# Patient Record
Sex: Female | Born: 1935 | Hispanic: Yes | Marital: Married | Smoking: Never smoker
Health system: Southern US, Community
[De-identification: ages and names within clinical notes are randomized; demographics above are authoritative.]

## PROBLEM LIST (undated history)

## (undated) DIAGNOSIS — Z780 Asymptomatic menopausal state: Secondary | ICD-10-CM

## (undated) DIAGNOSIS — M810 Age-related osteoporosis without current pathological fracture: Secondary | ICD-10-CM

## (undated) DIAGNOSIS — N905 Atrophy of vulva: Secondary | ICD-10-CM

## (undated) DIAGNOSIS — K219 Gastro-esophageal reflux disease without esophagitis: Secondary | ICD-10-CM

## (undated) DIAGNOSIS — N952 Postmenopausal atrophic vaginitis: Secondary | ICD-10-CM

## (undated) DIAGNOSIS — K449 Diaphragmatic hernia without obstruction or gangrene: Secondary | ICD-10-CM

## (undated) DIAGNOSIS — G4700 Insomnia, unspecified: Secondary | ICD-10-CM

## (undated) DIAGNOSIS — H9319 Tinnitus, unspecified ear: Secondary | ICD-10-CM

## (undated) HISTORY — PX: TONSILLECTOMY AND ADENOIDECTOMY: SUR1326

## (undated) HISTORY — DX: Atrophy of vulva: N90.5

## (undated) HISTORY — DX: Diaphragmatic hernia without obstruction or gangrene: K44.9

## (undated) HISTORY — DX: Gastro-esophageal reflux disease without esophagitis: K21.9

## (undated) HISTORY — DX: Tinnitus, unspecified ear: H93.19

## (undated) HISTORY — DX: Age-related osteoporosis without current pathological fracture: M81.0

## (undated) HISTORY — DX: Insomnia, unspecified: G47.00

## (undated) HISTORY — PX: CHOLECYSTECTOMY: SHX55

## (undated) HISTORY — PX: EYE SURGERY: SHX253

## (undated) HISTORY — DX: Asymptomatic menopausal state: Z78.0

## (undated) HISTORY — DX: Postmenopausal atrophic vaginitis: N95.2

---

## 2003-01-19 DIAGNOSIS — G4730 Sleep apnea, unspecified: Secondary | ICD-10-CM

## 2005-08-14 ENCOUNTER — Ambulatory Visit: Payer: Self-pay | Admitting: Unknown Physician Specialty

## 2005-09-08 ENCOUNTER — Ambulatory Visit: Payer: Self-pay | Admitting: Obstetrics and Gynecology

## 2006-10-18 ENCOUNTER — Ambulatory Visit: Payer: Self-pay | Admitting: Obstetrics and Gynecology

## 2007-01-03 ENCOUNTER — Ambulatory Visit: Payer: Self-pay | Admitting: Internal Medicine

## 2007-01-07 ENCOUNTER — Ambulatory Visit: Payer: Self-pay | Admitting: Internal Medicine

## 2007-11-07 ENCOUNTER — Ambulatory Visit: Payer: Self-pay | Admitting: Obstetrics and Gynecology

## 2008-11-09 ENCOUNTER — Ambulatory Visit: Payer: Self-pay | Admitting: Obstetrics and Gynecology

## 2008-11-23 ENCOUNTER — Ambulatory Visit: Payer: Self-pay | Admitting: Obstetrics and Gynecology

## 2010-02-03 ENCOUNTER — Ambulatory Visit: Payer: Self-pay | Admitting: Internal Medicine

## 2010-07-18 ENCOUNTER — Ambulatory Visit: Payer: Self-pay | Admitting: Internal Medicine

## 2011-08-24 ENCOUNTER — Ambulatory Visit: Payer: Self-pay | Admitting: Obstetrics and Gynecology

## 2012-07-29 ENCOUNTER — Ambulatory Visit: Payer: Self-pay | Admitting: Unknown Physician Specialty

## 2012-08-01 ENCOUNTER — Ambulatory Visit: Payer: Self-pay | Admitting: Unknown Physician Specialty

## 2012-08-19 ENCOUNTER — Ambulatory Visit: Payer: Self-pay | Admitting: Unknown Physician Specialty

## 2012-10-31 ENCOUNTER — Ambulatory Visit: Payer: Self-pay | Admitting: Obstetrics and Gynecology

## 2012-12-25 ENCOUNTER — Ambulatory Visit: Payer: Self-pay | Admitting: Internal Medicine

## 2012-12-31 ENCOUNTER — Ambulatory Visit: Payer: Self-pay | Admitting: Internal Medicine

## 2013-03-03 DIAGNOSIS — R634 Abnormal weight loss: Secondary | ICD-10-CM | POA: Insufficient documentation

## 2013-03-03 DIAGNOSIS — Q6211 Congenital occlusion of ureteropelvic junction: Secondary | ICD-10-CM

## 2013-03-03 DIAGNOSIS — Q6239 Other obstructive defects of renal pelvis and ureter: Secondary | ICD-10-CM | POA: Insufficient documentation

## 2013-07-30 ENCOUNTER — Ambulatory Visit: Payer: Self-pay | Admitting: Surgery

## 2013-07-30 LAB — CBC WITH DIFFERENTIAL/PLATELET
Basophil #: 0.1 10*3/uL (ref 0.0–0.1)
Eosinophil #: 0.1 10*3/uL (ref 0.0–0.7)
Eosinophil %: 2.2 %
HCT: 40.1 % (ref 35.0–47.0)
HGB: 13.7 g/dL (ref 12.0–16.0)
MCHC: 34.2 g/dL (ref 32.0–36.0)
Monocyte %: 5.7 %
Neutrophil #: 3.6 10*3/uL (ref 1.4–6.5)
RBC: 4.3 10*6/uL (ref 3.80–5.20)
WBC: 6.2 10*3/uL (ref 3.6–11.0)

## 2013-07-30 LAB — BASIC METABOLIC PANEL
BUN: 20 mg/dL — ABNORMAL HIGH (ref 7–18)
EGFR (African American): >60
EGFR (Non-African Amer.): >60
Glucose: 80 mg/dL (ref 65–99)
Osmolality: 281 (ref 275–301)
Potassium: 4.3 mmol/L (ref 3.5–5.1)

## 2013-08-01 ENCOUNTER — Ambulatory Visit: Payer: Self-pay | Admitting: Surgery

## 2013-08-01 LAB — URINALYSIS, COMPLETE
Glucose,UR: NEGATIVE mg/dL (ref 0–75)
Ketone: NEGATIVE
Nitrite: NEGATIVE
Ph: 6 (ref 4.5–8.0)
Protein: NEGATIVE
Specific Gravity: 1.015 (ref 1.003–1.030)

## 2013-08-07 ENCOUNTER — Ambulatory Visit: Payer: Self-pay | Admitting: Surgery

## 2013-08-08 LAB — PATHOLOGY REPORT

## 2013-08-10 ENCOUNTER — Emergency Department: Payer: Self-pay | Admitting: Emergency Medicine

## 2013-08-10 LAB — URINALYSIS, COMPLETE
Bilirubin,UR: NEGATIVE
Glucose,UR: NEGATIVE mg/dL (ref 0–75)
Ketone: NEGATIVE
Leukocyte Esterase: NEGATIVE
Ph: 5 (ref 4.5–8.0)
Protein: 25
RBC,UR: 1 /HPF (ref 0–5)

## 2013-08-10 LAB — COMPREHENSIVE METABOLIC PANEL
Alkaline Phosphatase: 108 U/L (ref 50–136)
Chloride: 104 mmol/L (ref 98–107)
Creatinine: 0.72 mg/dL (ref 0.60–1.30)
EGFR (African American): >60
Osmolality: 274 (ref 275–301)
SGPT (ALT): 46 U/L (ref 12–78)
Total Protein: 6.6 g/dL (ref 6.4–8.2)

## 2013-08-10 LAB — CBC
HGB: 13 g/dL (ref 12.0–16.0)
MCHC: 33.7 g/dL (ref 32.0–36.0)
RBC: 4.1 10*6/uL (ref 3.80–5.20)

## 2013-12-25 DIAGNOSIS — R42 Dizziness and giddiness: Secondary | ICD-10-CM | POA: Insufficient documentation

## 2013-12-25 DIAGNOSIS — R030 Elevated blood-pressure reading, without diagnosis of hypertension: Secondary | ICD-10-CM | POA: Insufficient documentation

## 2013-12-25 DIAGNOSIS — M79609 Pain in unspecified limb: Secondary | ICD-10-CM | POA: Insufficient documentation

## 2013-12-25 DIAGNOSIS — K219 Gastro-esophageal reflux disease without esophagitis: Secondary | ICD-10-CM | POA: Insufficient documentation

## 2013-12-25 DIAGNOSIS — M810 Age-related osteoporosis without current pathological fracture: Secondary | ICD-10-CM | POA: Insufficient documentation

## 2013-12-25 DIAGNOSIS — M1610 Unilateral primary osteoarthritis, unspecified hip: Secondary | ICD-10-CM | POA: Insufficient documentation

## 2013-12-25 DIAGNOSIS — H9209 Otalgia, unspecified ear: Secondary | ICD-10-CM | POA: Insufficient documentation

## 2013-12-25 DIAGNOSIS — M169 Osteoarthritis of hip, unspecified: Secondary | ICD-10-CM | POA: Insufficient documentation

## 2013-12-25 DIAGNOSIS — IMO0002 Reserved for concepts with insufficient information to code with codable children: Secondary | ICD-10-CM | POA: Insufficient documentation

## 2013-12-25 DIAGNOSIS — R002 Palpitations: Secondary | ICD-10-CM | POA: Insufficient documentation

## 2014-01-14 ENCOUNTER — Ambulatory Visit: Payer: Self-pay | Admitting: Internal Medicine

## 2014-07-09 DIAGNOSIS — E539 Vitamin B deficiency, unspecified: Secondary | ICD-10-CM | POA: Insufficient documentation

## 2014-08-17 ENCOUNTER — Ambulatory Visit: Payer: Self-pay | Admitting: Physician Assistant

## 2015-03-12 NOTE — Op Note (Signed)
PATIENT NAME:  Barbara Stewart, [name] MR#:  [id number] DATE OF BIRTH:  Jan 01, 1934  DATE OF PROCEDURE:  08/07/2013  PREOPERATIVE DIAGNOSIS: Chronic cholecystitis.    POSTOPERATIVE DIAGNOSIS: Chronic cholecystitis.   OPERATION: Robotic-assisted laparoscopic cholecystectomy.   SURGEON: Quentin Orealph L. Ely III, MD  ANESTHESIA: General.   OPERATIVE PROCEDURE: With the patient in the supine position, after induction of appropriate general anesthesia, the patient's abdomen was prepped with ChloraPrep and draped with sterile towels. The patient was placed in the head down, feet up position, and a small infraumbilical incision was made in the standard fashion. The incision was carried down through the subcutaneous tissue bluntly. Anterior fascia was identified and opened approximately 25 mm below the umbilicus. The single-site device was brought to the table and inserted into the abdominal cavity. It was rotated into the proper position. The patient was then placed in the head up, feet down position and rotated slightly to the left side. The camera port was inserted and the camera port docked to the robot. The second and first ports were inserted under direct vision, appropriately positioned and docked with the robot sequentially. Assistant port was placed. The abdomen had previously been insufflated. I then went to the console. The gallbladder was grasped with the assistant port and elevated. The hepatoduodenal ligament was easily visualized. The common bile duct was visualized. The cystic duct was dissected free from the hepatoduodenal ligament. The duct was doubly clipped on the common duct side, singly clipped on the gallbladder side and divided. The cystic artery was identified and similarly clipped, 2 clips on the proximal side and a single clip on the distal side. It too was divided. The gallbladder was then dissected free from its bed in the liver with hook and cautery apparatus. Once the gallbladder was free, the area  was copiously suctioned and irrigated. The robot was undocked. The ports were withdrawn and the gallbladder removed through the umbilical port. The midline fascia was closed with figure-of-eight sutures of 0 Maxon. The skin was closed with 5-0 nylon. The area was infiltrated with 0.25% Marcaine for postoperative pain control. Sterile dressings were applied. The patient was returned to the recovery room, having tolerated the procedure well. Sponge, instrument and needle counts were correct x2 in the operating room.   ____________________________ Quentin Orealph L. Ely III, MD rle:OSi D: 08/07/2013 09:31:32 ET T: 08/07/2013 09:46:45 ET JOB#: [id number]  cc: Carmie Endalph L. Ely III, MD, <Dictator> Lynnea FerrierBert J. Klein III, MD Quentin OreALPH L ELY MD ELECTRONICALLY SIGNED 08/11/2013 78:2920:04

## 2015-09-24 ENCOUNTER — Other Ambulatory Visit: Payer: Self-pay | Admitting: Internal Medicine

## 2015-09-24 DIAGNOSIS — Z1231 Encounter for screening mammogram for malignant neoplasm of breast: Secondary | ICD-10-CM

## 2015-11-25 ENCOUNTER — Emergency Department: Payer: Medicare HMO

## 2015-11-25 ENCOUNTER — Encounter: Payer: Self-pay | Admitting: Emergency Medicine

## 2015-11-25 ENCOUNTER — Emergency Department
Admission: EM | Admit: 2015-11-25 | Discharge: 2015-11-25 | Disposition: A | Discharge status: Home / Self Care | Payer: Medicare HMO | Attending: Emergency Medicine | Admitting: Emergency Medicine

## 2015-11-25 DIAGNOSIS — R202 Paresthesia of skin: Secondary | ICD-10-CM | POA: Diagnosis not present

## 2015-11-25 DIAGNOSIS — R252 Cramp and spasm: Secondary | ICD-10-CM | POA: Diagnosis not present

## 2015-11-25 DIAGNOSIS — Z79899 Other long term (current) drug therapy: Secondary | ICD-10-CM | POA: Diagnosis not present

## 2015-11-25 DIAGNOSIS — M79605 Pain in left leg: Secondary | ICD-10-CM | POA: Diagnosis present

## 2015-11-25 LAB — COMPREHENSIVE METABOLIC PANEL
ALBUMIN: 4.1 g/dL (ref 3.5–5.0)
ALK PHOS: 94 U/L (ref 38–126)
ALT: 20 U/L (ref 14–54)
ANION GAP: 7 (ref 5–15)
AST: 22 U/L (ref 15–41)
BUN: 16 mg/dL (ref 6–20)
CALCIUM: 8.8 mg/dL — AB (ref 8.9–10.3)
CHLORIDE: 106 mmol/L (ref 101–111)
CO2: 26 mmol/L (ref 22–32)
Creatinine, Ser: 0.53 mg/dL (ref 0.44–1.00)
GFR calc Af Amer: >60 mL/min (ref >60)
GFR calc non Af Amer: >60 mL/min (ref >60)
GLUCOSE: 94 mg/dL (ref 65–99)
Potassium: 4.1 mmol/L (ref 3.5–5.1)
SODIUM: 139 mmol/L (ref 135–145)
Total Bilirubin: 0.7 mg/dL (ref 0.3–1.2)
Total Protein: 6.7 g/dL (ref 6.5–8.1)

## 2015-11-25 LAB — CBC
HCT: 41.5 % (ref 35.0–47.0)
Hemoglobin: 13.9 g/dL (ref 12.0–16.0)
MCH: 30.9 pg (ref 26.0–34.0)
MCHC: 33.5 g/dL (ref 32.0–36.0)
MCV: 92.2 fL (ref 80.0–100.0)
PLATELETS: 190 10*3/uL (ref 150–440)
RBC: 4.5 MIL/uL (ref 3.80–5.20)
RDW: 12.6 % (ref 11.5–14.5)
WBC: 6.8 10*3/uL (ref 3.6–11.0)

## 2015-11-25 LAB — DIFFERENTIAL
Basophils Absolute: 0.1 10*3/uL (ref 0–0.1)
Basophils Relative: 1 %
Eosinophils Absolute: 0.1 10*3/uL (ref 0–0.7)
Eosinophils Relative: 1 %
LYMPHS PCT: 18 %
Lymphs Abs: 1.2 10*3/uL (ref 1.0–3.6)
Monocytes Absolute: 0.4 10*3/uL (ref 0.2–0.9)
Monocytes Relative: 6 %
NEUTROS ABS: 5.1 10*3/uL (ref 1.4–6.5)
NEUTROS PCT: 74 %

## 2015-11-25 LAB — APTT: APTT: 26 s (ref 24–36)

## 2015-11-25 LAB — PROTIME-INR
INR: 1.04
PROTHROMBIN TIME: 13.8 s (ref 11.4–15.0)

## 2015-11-25 MED ORDER — IBUPROFEN 400 MG PO TABS
400.0000 mg | ORAL_TABLET | ORAL | Status: AC
  Administered 2015-11-25: 400 mg via ORAL
  Filled 2015-11-25: qty 1

## 2015-11-25 NOTE — Discharge Instructions (Signed)
Please follow up closely with your doctor. Please come back to the emergency room if you notice swelling, ongoing pain, worsening numbness, or other new concerns.  Calambres y espasmos musculares  (Muscle Cramps and Spasms)  Los calambres musculares y espasmos ocurren cuando un msculo o grupos de msculos se tensan y no se tiene control sobre esta tensin (contraccin muscular involuntaria). Es un problema comn y Software engineerpuede aparecer en cualquier msculo. La zona ms comn son los msculos de la pantorrilla. Tanto los Liberty Globalcalambres como los espasmos son contracciones musculares involuntarias, pero tambin tienen diferencias:   Los calambres musculares son espordicos y Engineer, miningdolorosos. Pueden durar entre algunos segundos hasta un cuarto de Baratariahora. Los calambres musculares son ms fuertes y duran ms que los espasmos musculares.  Los espasmos pueden o no ser dolorosos. Pueden durar algunos segundos o mucho ms. CAUSAS  No es frecuente que los calambres se deban a un trastorno subyacente grave. En muchos casos, la causa de los calambres y los espasmos es desconocida. Algunas causas frecuentes son:   Esfuerzo excesivo.   El uso excesivo del msculo por movimientos repetitivos (hacer lo mismo una y Laverda Pageotra vez).   Permanecer en cierta posicin durante un largo perodo de [place] Springstiempo.   Preparacin, forma o tcnica inadecuada al realizar un deporte o [place] Cityactividad.   Deshidratacin.   Traumatismos.   Efectos secundarios de algunos medicamentos.  Niveles anormalmente bajos de las sales e iones en la sangre (electrolitos), especialmente el potasio y el calcio. Pueden ocurrir cuando se toman pldoras para Geographical information systems officerorinar (diurticos) o en las mujeres embarazadas.  Algunos problemas mdicos subyacentes pueden hacer que sea ms propenso a desarrollar calambres o espasmos. Estos incluyen, pero no se limitan a:   Diabetes.   Enfermedad de Parkinson.   Trastornos hormonales, tales como problemas de la tiroides.   El  consumo excesivo de alcohol.   Enfermedades especficas de Harrah's Entertainmentlos msculos, las articulaciones y Peabodylos huesos.   Enfermedad vascular en la que no llega suficiente sangre a los msculos.  INSTRUCCIONES PARA EL CUIDADO EN EL HOGAR   Mantngase bien hidratado. Beba gran cantidad de lquido para mantener la orina de tono claro o color amarillo plido.  Puede ser til Engineer, maintenance (IT)masajear, Therapist, musicelongar y International aid/development workerrelajar el msculo afectado.  Para los msculos tensos o apretados, use una toalla caliente, una almohadilla trmica o agua caliente de la ducha dirigida a la zona afectada.  Si est dolorido o siente dolor despus de un calambre o espasmo, aplique hielo en el rea afectada para Acupuncturistaliviar el malestar.  Ponga el hielo en una bolsa plstica.  Colquese una toalla entre la piel y la bolsa de hielo.  Deje el hielo en el lugar durante 15 a 20 minutos, 3 a 4 veces por da.  Los medicamentos que se utilizan para tratar las causas conocidas de los calambres o espasmos pueden reducir su frecuencia o gravedad. Tome slo medicamentos de venta libre o recetados, segn las indicaciones del mdico. SOLICITE ATENCIN MDICA SI:  Los calambres o espasmos empeoran, ocurren con ms frecuencia o no mejoran con Museum/gallery conservatorel tiempo.  ASEGRESE DE QUE:   Comprende estas instrucciones.  Controlar su enfermedad.  Solicitar ayuda de inmediato si no mejora o si empeora.   Esta informacin no tiene Theme park managercomo fin reemplazar el consejo del mdico. Asegrese de hacerle al mdico cualquier pregunta que tenga.   Document Released: 08/16/2005 Document Revised: 03/03/2013 Elsevier Interactive Patient Education [employer]2016 Elsevier Inc.

## 2015-11-25 NOTE — ED Notes (Signed)
Difficult to get accurate picture of complain despite using stratus; [phone]

## 2015-11-25 NOTE — ED Notes (Addendum)
Spoke with dr Mayford Knifewilliams. Sx were present when pt awoke at 3 am.  Will not call out code stroke but work pt up as such.  Charge rn aware pt

## 2015-11-25 NOTE — ED Notes (Signed)
Pt c/o left leg numbness and cramping upon waking this morning.  Was able to walk but reports leaning to left side when walking.  C/o pain from ankle up and numbness still.  Took ASA this morning.  Also c/o sweating. Skin warm and dry at this time.

## 2015-11-25 NOTE — ED Notes (Signed)
Discussed discharge instructions and follow-up care with the patient. No questions or concerns at this time. Pt stable at discharge.  

## 2015-11-25 NOTE — ED Provider Notes (Addendum)
[facility] Emergency Department Provider Note REMINDER - THIS NOTE IS NOT A FINAL MEDICAL RECORD UNTIL IT IS SIGNED. UNTIL THEN, THE CONTENT BELOW MAY REFLECT INFORMATION FROM A DOCUMENTATION TEMPLATE, NOT THE ACTUAL PATIENT VISIT. ____________________________________________  Time seen: Approximately 4:26 PM  I have reviewed the triage vital signs and the nursing notes.   HISTORY  Chief Complaint Leg Pain  Started utilizing tele-interpreter, however patient and her daughter after about 10 minutes requested not to utilize interpreter Longer. Her daughter works as a Equities trader for the [employer] techn[employer].  HPI Barbara Stewart is a 82 y.o. female for evaluation of left leg pain and tingling.  Patient awoke this morning and had a cramp in the back of her left thigh. She reports that it was very hard and painful, and that went away after about 5 minutes of massaging. After that improves she had ongoing achiness the left leg when walking. She also has a slight feeling of a tingling sensation in the back of the left calf which has improved. No trouble speaking, no weakness, no numbness or tingling in the face and hands or anywhere else.  She reports she feels like she had a bad muscle "cramp". She does come here and wants to make sure she does not have a blood clot in the leg.  Daughter reports she doesn't also a long history of lower back pain, for the last 3-4 days after being outside and she had been noticing more pain shooting down from her left leg and on her buttock which she has had previously. They believe this is related.   History reviewed. No pertinent past medical history.  There are no active problems to display for this patient.   Past Surgical History  Procedure Laterality Date  . Cholecystectomy      Current Outpatient Rx  Name  Route  Sig  Dispense  Refill  . Digestive Enzymes (DIGESTIVE ENZYME PO)   Oral   Take 1 tablet by mouth 3 (three) times  daily with meals.          . Probiotic Product (PROBIOTIC PO)   Oral   Take 1 capsule by mouth daily.         . ranitidine (ZANTAC) 150 MG tablet   Oral   Take 150 mg by mouth 2 (two) times daily.           Allergies Aspirin  History reviewed. No pertinent family history.  Social History Social History  Substance Use Topics  . Smoking status: Never Smoker   . Smokeless tobacco: None  . Alcohol Use: No    Review of Systems Constitutional: No fever/chills Eyes: No visual changes. ENT: No sore throat. Cardiovascular: Denies chest pain. Respiratory: Denies shortness of breath. Gastrointestinal: No abdominal pain.  No nausea, no vomiting.  No diarrhea.  No constipation. Genitourinary: Negative for dysuria. Musculoskeletal: Negative for back pain. Skin: Negative for rash. Neurological: Negative for headaches, focal weakness or numbness for some slight tingling in the left thigh.  10-point ROS otherwise negative.  ____________________________________________   PHYSICAL EXAM:  VITAL SIGNS: ED Triage Vitals  Enc Vitals Group     BP 11/25/15 1228 114/98 mmHg     Pulse Rate 11/25/15 1228 64     Resp 11/25/15 1228 16     Temp 11/25/15 1228 98.2 F (36.8 C)     Temp Source 11/25/15 1228 Oral     SpO2 11/25/15 1228 100 %     Weight 11/25/15  1228 100 lb (45.36 kg)     Height 11/25/15 1228 4\' 10"  (1.473 m)     Head Cir --      Peak Flow --      Pain Score 11/25/15 1238 4     Pain Loc --      Pain Edu? --      Excl. in GC? --    Constitutional: Alert and oriented. Well appearing and in no acute distress. Eyes: Conjunctivae are normal. PERRL. EOMI. Head: Atraumatic. Nose: No congestion/rhinnorhea. Mouth/Throat: Mucous membranes are moist.  Oropharynx non-erythematous. Neck: No stridor.   Cardiovascular: Normal rate, regular rhythm. Grossly normal heart sounds.  Good peripheral circulation. Respiratory: Normal respiratory effort.  No retractions. Lungs  CTAB. Gastrointestinal: Soft and nontender. No distention. No abdominal bruits. No CVA tenderness. Musculoskeletal: No lower extremity tenderness nor edema.  No joint effusions.  Lower Extremities  No edema. Normal DP/PT pulses left leg.  Normal neuro-motor function lower extremities bilateral.  RIGHT Right lower extremity demonstrates normal strength, good use of all muscles. No edema bruising or contusions of the right hip, right knee, right ankle. Full range of motion of the right lower extremity without pain. No pain on axial loading. No evidence of trauma.  LEFT Left lower extremity demonstrates normal strength, good use of all muscles. No edema bruising or contusions of the hip,  knee, ankle. Full range of motion of the left lower extremity without pain. No pain on axial loading. No evidence of trauma. There is some mild tenderness in the left thigh without evidence of edema. In addition, the patient reports that she has a very slight tingling feeling along the back left calf. She does demonstrate some tenderness over the left sciatic notch. She has some mild tenderness along the left paraspinous region on exam.   Neurologic:  Normal speech and language. No gross focal neurologic deficits are appreciated. No gait instability.  The patient has no pronator drift. The patient has normal cranial nerve exam. Extraocular movements are normal. Patient has 5 out of 5 strength in all extremities. There is no numbness or gross, acute sensory abnormality in the extremities bilaterally. No speech disturbance. No dysarthria. No aphasia. No ataxia. Normal finger nose finger bilat. Patient speaking in full and clear sentences.   Skin:  Skin is warm, dry and intact. No rash noted. Psychiatric: Mood and affect are normal. Speech and behavior are normal.  ____________________________________________   LABS (all labs ordered are listed, but only abnormal results are displayed)  Labs  Reviewed  COMPREHENSIVE METABOLIC PANEL - Abnormal; Notable for the following:    Calcium 8.8 (*)    All other components within normal limits  PROTIME-INR  APTT  CBC  DIFFERENTIAL  I-STAT TROPOININ, ED  I-STAT CHEM 8, ED   ____________________________________________  EKG   ____________________________________________  RADIOLOGY  CT the head was performed from triage to evaluate for evidence of stroke, however on my assessment and discussion with the patient I find nothing to suggest concern for an acute stroke.   CT Head Wo Contrast (Final result) Result time: 11/25/15 13:20:50   Final result by Rad Results In Interface (11/25/15 13:20:50)   Narrative:   CLINICAL DATA: Left leg numbness  EXAM: CT HEAD WITHOUT CONTRAST  TECHNIQUE: Contiguous axial images were obtained from the base of the skull through the vertex without intravenous contrast.  COMPARISON: CT head 08/17/2014  FINDINGS: Cerebral volume normal for age. Negative for hydrocephalus.  Chronic microvascular ischemia in the periventricular white  matter left greater than right. This is stable  Negative for acute infarct. Negative for hemorrhage or mass.  Negative calvarium.  IMPRESSION: Chronic microvascular ischemic changes left greater than right. No acute abnormality.   Electronically Signed By: Marlan Palau M.D. On: 11/25/2015 13:20      ____________________________________________   PROCEDURES  Procedure(s) performed: None  Critical Care performed: No  ____________________________________________   INITIAL IMPRESSION / ASSESSMENT AND PLAN / ED COURSE  Pertinent labs & imaging results that were available during my care of the patient were reviewed by me and considered in my medical decision making (see chart for details).  Patient transfer evaluation of slight pain in the left calf after having an episode of pain and spasming this morning. She's also been having some  mild low backache with symptoms that seem to be suggestive smiled sciatica without evidence of cauda equina or herniation/acute deficit. She is awake alert reports her symptoms are much better at this time., She has no risk factor for thromboembolic disease. Her neurologic exam is normal. I suspect that there was some difficulty with communication that initially prompted a stroke screen, however nothing to support this by clinical history or exam. I did offer her and recommended ultrasound of the lower leg to evaluate for a "blood clot", but due to time and patient and her family request to follow-up with her primary care doctor and we did discuss that she could have a blood clot though I think it is very unlikely, and I am agreeable with plan for outpatient follow-up for this as well. She and her family are all agreeable.  Discharge to home, close follow-up with primary care doctor advise. Patient and family agreeable. Return precautions advised. ____________________________________________   FINAL CLINICAL IMPRESSION(S) / ED DIAGNOSES  Final diagnoses:  Cramp and spasm      Sharyn Creamer, MD 11/25/15 1747  ED ECG REPORT I, QUALE, MARK, the attending physician, personally viewed and interpreted this ECG.  Date: 11/25/2015 EKG Time: 1300 Rate: 60 Rhythm: normal sinus rhythm QRS Axis: normal Intervals: normal ST/T Wave abnormalities: normal Conduction Disutrbances: none Narrative Interpretation: unremarkable   Sharyn Creamer, MD 11/25/15 1925

## 2015-12-28 DIAGNOSIS — M5416 Radiculopathy, lumbar region: Secondary | ICD-10-CM | POA: Insufficient documentation

## 2016-03-29 ENCOUNTER — Ambulatory Visit (INDEPENDENT_AMBULATORY_CARE_PROVIDER_SITE_OTHER): Payer: Medicare HMO | Admitting: Obstetrics and Gynecology

## 2016-03-29 ENCOUNTER — Encounter: Payer: Self-pay | Admitting: Obstetrics and Gynecology

## 2016-03-29 VITALS — BP 131/77 | HR 61 | Ht 59.0 in | Wt 101.8 lb

## 2016-03-29 DIAGNOSIS — R102 Pelvic and perineal pain: Secondary | ICD-10-CM | POA: Diagnosis not present

## 2016-03-29 DIAGNOSIS — Z1231 Encounter for screening mammogram for malignant neoplasm of breast: Secondary | ICD-10-CM

## 2016-03-29 LAB — POCT URINALYSIS DIPSTICK
BILIRUBIN UA: NEGATIVE
Blood, UA: NEGATIVE
Glucose, UA: NEGATIVE
KETONES UA: NEGATIVE
Nitrite, UA: NEGATIVE
PH UA: 5
PROTEIN UA: NEGATIVE
Urobilinogen, UA: 0.2

## 2016-03-29 NOTE — Progress Notes (Signed)
GYN ENCOUNTER NOTE  Subjective:       Barbara Stewart is a 82 y.o. [id number] female is here for gynecologic evaluation of the following issues:  1. Lower abdominal discomfort  82 year old [place] female para 2002, menopausal, on no HRT therapy, presents for evaluation of lower abdominal pain which has developed over the past several months. Pain is lower abdominal discomfort which tends to wax and wane following activities such as yard work. Symptoms develop after work is completed and may persist for up to 24 hours. No change in bowel function. No change in bladder function. No vaginal bleeding. Patient is concerned about heaviness in her pelvis. She has not been seen and one half years.   Gynecologic History No LMP recorded. Patient is postmenopausal. Contraception: post menopausal status Last Pap: No abnormalities; no further testing needed Last mammogram: 11/2013 within normal limits  Obstetric History OB History  Gravida Para Term Preterm AB SAB TAB Ectopic Multiple Living  2 2 2       2     # Outcome Date GA Lbr Len/2nd Weight Sex Delivery Anes PTL Lv  2 Term      Vag-Spont   Y  1 Term      Vag-Spont   Y      Past Medical History  Diagnosis Date  . Vaginal atrophy   . Osteoporosis   . GERD (gastroesophageal reflux disease)   . Hiatal hernia   . Menopause   . Atrophy, vulva   . Insomnia   . Tinnitus     Past Surgical History  Procedure Laterality Date  . Cholecystectomy    . Eye surgery      cataract  . Cholecystectomy    . Tonsillectomy and adenoidectomy      Current Outpatient Prescriptions on File Prior to Visit  Medication Sig Dispense Refill  . Probiotic Product (PROBIOTIC PO) Take 1 capsule by mouth daily.    . ranitidine (ZANTAC) 150 MG tablet Take 150 mg by mouth 2 (two) times daily.     No current facility-administered medications on file prior to visit.    Allergies  Allergen Reactions  . Aspirin Other (See Comments)    Reaction: Burning in stomach Pt  states she doesn't tolerate it well.    Social History   Social History  . Marital Status: Married    Spouse Name: N/A  . Number of Children: N/A  . Years of Education: N/A   Occupational History  . Not on file.   Social History Main Topics  . Smoking status: Never Smoker   . Smokeless tobacco: Not on file  . Alcohol Use: Yes     Comment: ocas  . Drug Use: No  . Sexual Activity: Not Currently   Other Topics Concern  . Not on file   Social History Narrative    Family History  Problem Relation Age of Onset  . Cirrhosis Mother   . Cirrhosis Father   . Diabetes Paternal Aunt     grt  . Cancer Neg Hx   . Heart disease Neg Hx     The following portions of the patient's history were reviewed and updated as appropriate: allergies, current medications, past family history, past medical history, past social history, past surgical history and problem list.  Review of Systems Review of Systems - General ROS: negative for - chills, fatigue, fever, hot flashes, malaise or night sweats Hematological and Lymphatic ROS: negative for - bleeding problems or swollen lymph nodes Gastrointestinal  ROS: negative for - abdominal pain, blood in stools, change in bowel habits and nausea/vomiting Musculoskeletal ROS: negative for - joint pain, muscle pain or muscular weakness Genito-Urinary ROS: negative for - change in menstrual cycle, dysmenorrhea, dyspareunia, dysuria, genital discharge, genital ulcers, hematuria, incontinence, irregular/heavy menses, nocturia or pelvic pain  Objective:   BP 131/77 mmHg  Pulse 61  Ht  (1.499 m)  Wt 101 lb 12.8 oz (46.176 kg)  BMI 20.55 kg/m2 CONSTITUTIONAL: Well-developed, well-nourished female in no acute distress.  HENT:  Normocephalic, atraumatic.  NECK: Normal range of motion, supple, no masses.  Normal thyroid.  SKIN: Skin is warm and dry. No rash noted. Not diaphoretic. No erythema. No pallor. NEUROLGIC: Alert and oriented to person,  place, and time. PSYCHIATRIC: Normal mood and affect. Normal behavior. Normal judgment and thought content. CARDIOVASCULAR:Not Examined RESPIRATORY: Not Examined BREASTS: Not Examined ABDOMEN: Soft, non distended; Non tender.  No Organomegaly. BACK: No CVA tenderness PELVIC:  External Genitalia: Normal  BUS: Normal  Vagina: Moderate atrophy; no lesions  Cervix: Normal; no cervical motion tenderness  Uterus: Normal size, shape,consistency, mobile  Adnexa: Normal  RV: Normal external exam; internal exam deferred  Bladder: Nontender MUSCULOSKELETAL: Normal range of motion. No tenderness.  No cyanosis, clubbing, or edema.     Assessment:   1. Pelvic pain in female,Nonacute, associated with activity without focal findings; normal pelvic exam - Urine culture - POCT urinalysis dipstick     Plan:   1. Reassurance is given 2. Return if symptoms increase or persist greater than 6 months 3. Return for physical in 1 year 4. Mammogram is authorized per patient request  A total of 15 minutes were spent face-to-face with the patient during this encounter and over half of that time dealt with counseling and coordination of care.  Herold Harms, MD  Note: This dictation was prepared with Dragon dictation along with smaller phrase technology. Any transcriptional errors that result from this process are unintentional.

## 2016-03-29 NOTE — Patient Instructions (Signed)
1. Screening mammogram is ordered 2. Return in 1 year for annual exam

## 2016-03-31 LAB — URINE CULTURE: ORGANISM ID, BACTERIA: NO GROWTH

## 2016-04-04 ENCOUNTER — Other Ambulatory Visit: Payer: Self-pay | Admitting: Gastroenterology

## 2016-04-04 DIAGNOSIS — R1310 Dysphagia, unspecified: Secondary | ICD-10-CM

## 2016-04-12 ENCOUNTER — Telehealth: Payer: Self-pay | Admitting: Obstetrics and Gynecology

## 2016-04-12 NOTE — Telephone Encounter (Signed)
Pts daughter Rachel Borina aware per vm -letter mailed stating  neg urine culture.

## 2016-04-12 NOTE — Telephone Encounter (Signed)
Barbara Stewart, Barbara Stewart's daughter, returned your call. She said her mother is "hard of hearing and won't answer anything that shows up in English."  Barbara Stewart said she mailed her a letter since she was unable to contact Ms. Barbara Stewart but to my understanding, Ms. Barbara Stewart can't read [place]. Barbara Borina would like a phone call regarding this.  Barbara Stewart's ph# [phone] Thank you.

## 2016-04-19 ENCOUNTER — Ambulatory Visit: Admission: RE | Admit: 2016-04-19 | Payer: Medicare HMO | Source: Ambulatory Visit

## 2016-05-01 DIAGNOSIS — E559 Vitamin D deficiency, unspecified: Secondary | ICD-10-CM | POA: Insufficient documentation

## 2016-05-03 ENCOUNTER — Ambulatory Visit
Admission: RE | Admit: 2016-05-03 | Discharge: 2016-05-03 | Disposition: A | Discharge status: Home / Self Care | Payer: Medicare HMO | Source: Ambulatory Visit | Attending: Gastroenterology | Admitting: Gastroenterology

## 2016-05-03 DIAGNOSIS — R1310 Dysphagia, unspecified: Secondary | ICD-10-CM | POA: Diagnosis not present

## 2016-05-03 DIAGNOSIS — R1013 Epigastric pain: Secondary | ICD-10-CM | POA: Diagnosis present

## 2016-05-03 DIAGNOSIS — R1312 Dysphagia, oropharyngeal phase: Secondary | ICD-10-CM

## 2016-05-03 NOTE — Therapy (Signed)
[facility] DIAGNOSTIC RADIOLOGY [address] [place], [place], [address] Phone: [phone]   Fax:     Modified Barium Swallow  Patient Details  Name: MAGIN BALBI MRN: [id number] Date of Birth: [birth year] No Data Recorded  Encounter Date: 05/03/2016      End of Session - 05/03/16 1503    Visit Number 1   Number of Visits 1   Date for SLP Re-Evaluation 05/03/16   SLP Start Time 1300   SLP Stop Time  1353   SLP Time Calculation (min) 53 min   Activity Tolerance Patient tolerated treatment well      Past Medical History  Diagnosis Date  . Vaginal atrophy   . Osteoporosis   . GERD (gastroesophageal reflux disease)   . Hiatal hernia   . Menopause   . Atrophy, vulva   . Insomnia   . Tinnitus     Past Surgical History  Procedure Laterality Date  . Cholecystectomy    . Eye surgery      cataract  . Cholecystectomy    . Tonsillectomy and adenoidectomy      There were no vitals filed for this visit.    Subjective: Patient behavior: (alertness, ability to follow instructions, etc.): Interpreter present; patient alert and able to follow directions  Chief complaint: patient's primary concern today is phlegm in the throat   Objective:  Radiological Procedure: A videoflouroscopic evaluation of oral-preparatory, reflex initiation, and pharyngeal phases of the swallow was performed; as well as a screening of the upper esophageal phase.  I. POSTURE: Upright in MBS chair  II. VIEW: Lateral  III. COMPENSATORY STRATEGIES: pill embedded in applesauce  IV. BOLUSES ADMINISTERED:    Thin Liquid: 1 small sip, 3 rapid consecutive sips   Nectar-thick Liquid: 1 medium size bolus    Puree: 2 teaspoon presentation   Mechanical Soft: 1/4 graham cracker in applesauce   Barium tablet  V. RESULTS OF EVALUATION: A. ORAL PREPARATORY PHASE: (The lips, tongue, and velum are observed for strength and coordination)       **Overall Severity  Rating: Within normal limits; xerostomia present  B. SWALLOW INITIATION/REFLEX: (The reflex is normal if "triggered" by the time the bolus reached the base of the tongue)  **Overall Severity Rating: Minimal; triggers at the valleculae  C. PHARYNGEAL PHASE: (Pharyngeal function is normal if the bolus shows rapid, smooth, and continuous transit through the pharynx and there is no pharyngeal residue after the swallow)  **Overall Severity Rating: Mild; reduced tongue base retraction with mild vallecular residue  D. LARYNGEAL PENETRATION: (Material entering into the laryngeal inlet/vestibule but not aspirated) Transient laryngeal with thin liquid   E. ASPIRATION: None  F. ESOPHAGEAL PHASE: (Screening of the upper esophagus): small amount of barium residue in cervical esophagus  ASSESSMENT: 82 year old woman, with complaint of mouth burning, excessive phlegm, globus, and pill sticking, is presenting with minimal oropharyngeal dysphagia.  With the exception of dry tablet sticking to dry mucus membranes, oral control of the bolus including oral hold, rotary mastication, and anterior to posterior transfer are within normal limits. Timing of the pharyngeal swallow is delayed, triggering at the valleculae.  With the exception of tongue base retraction, aspects of the pharyngeal stage of swallowing including hyolaryngeal excursion, epiglottic inversion, duration/amplitude of UES opening, and laryngeal vestibule closure at the height of the swallow are within normal limits.  There is mild vallecular residue.  There is transient laryngeal penetration and no observed aspiration.  The patient  had difficulty with posterior transfer of a dry barium tablet, but was able to easily effect posterior transfer when the tablet was place in small bolus of applesauce.  The patient is at not at significant risk for prandial aspiration.  The patient had an interpreter present for the study and appears to understand the results.   The patient was given Spanish written description of the MBSS.  She appeared pleased with the ease of swallowing the tablet when embedded in applesauce.   PLAN/RECOMMENDATIONS:   A. Diet: Regular as tolerated- moistening foods may be beneficial   B. Swallowing Precautions: Stand   C. Recommended consultation to: follow up with GI as recommended   D. Therapy recommendations N/A   E. Results and recommendations were discussed with the patient via interpreter immediately following the study and the final report routed to referring practitioner.    Dysphagia, oropharyngeal phase  Dysphagia - Plan: DG OP Swallowing Func-Medicare/Speech Path, DG OP Swallowing Func-Medicare/Speech Path      G-Codes - 05/03/16 1504    Functional Assessment Tool Used MBS, clinical judgment   Functional Limitations Swallowing   Swallow Current Status ([id number]) At least 1 percent but less than 20 percent impaired, limited or restricted   Swallow Goal Status ([id number]([id number]) At least 1 percent but less than 20 percent impaired, limited or restricted   Swallow Discharge Status [phone]([id number]) At least 1 percent but less than 20 percent impaired, limited or restricted          Problem List There are no active problems to display for this patient.   Leandrew Koyanagibernathy, Susie 05/03/2016, 3:05 PM   [facility] DIAGNOSTIC RADIOLOGY [address] [place], [place], [address] Phone: [phone]   Fax:     Name: Elenora Gammaida A Virnig MRN: [id number] Date of Birth: 10/27/1934

## 2016-06-05 DIAGNOSIS — R202 Paresthesia of skin: Secondary | ICD-10-CM

## 2016-06-05 DIAGNOSIS — R200 Anesthesia of skin: Secondary | ICD-10-CM | POA: Insufficient documentation

## 2016-08-09 ENCOUNTER — Ambulatory Visit
Admission: RE | Admit: 2016-08-09 | Discharge: 2016-08-09 | Disposition: A | Discharge status: Home / Self Care | Payer: Medicare HMO | Source: Ambulatory Visit | Attending: Obstetrics and Gynecology | Admitting: Obstetrics and Gynecology

## 2016-08-09 DIAGNOSIS — Z1231 Encounter for screening mammogram for malignant neoplasm of breast: Secondary | ICD-10-CM | POA: Diagnosis not present

## 2016-09-04 IMAGING — RF DG SWALLOWING FUNCTION
12 series · 13 of 24 positions shown · non-contrast
Comparison: None.

CLINICAL DATA: Dysphagia.

EXAM:
MODIFIED BARIUM SWALLOW
TECHNIQUE: Different consistencies of barium were administered orally to the
patient by the Speech Pathologist. Imaging of the pharynx was
performed in the lateral projection.
FLUOROSCOPY TIME:  Radiation Exposure Index (as provided by the
fluoroscopic device): 1.4 mGy
Scratch

[Series 1: run · 1 of 30 frames shown (1 of 12)]
[frame 5/30]
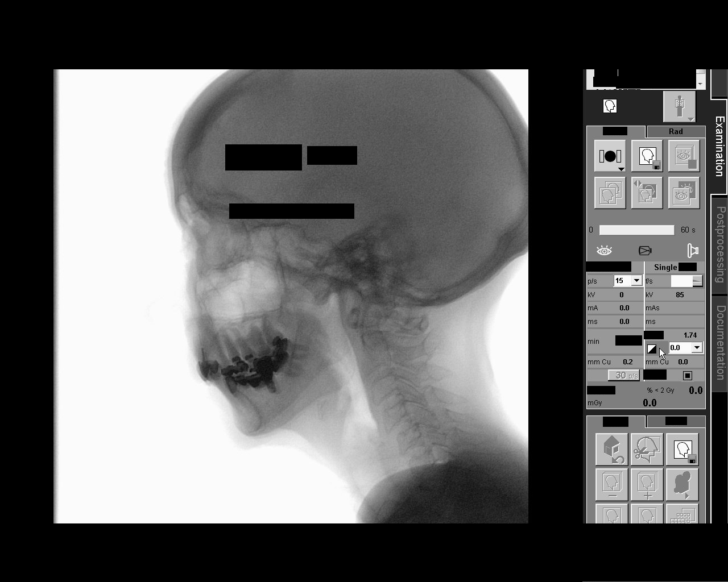

[Series 2: run · 1 of 56 frames shown (2 of 12)]
[frame 9/56]
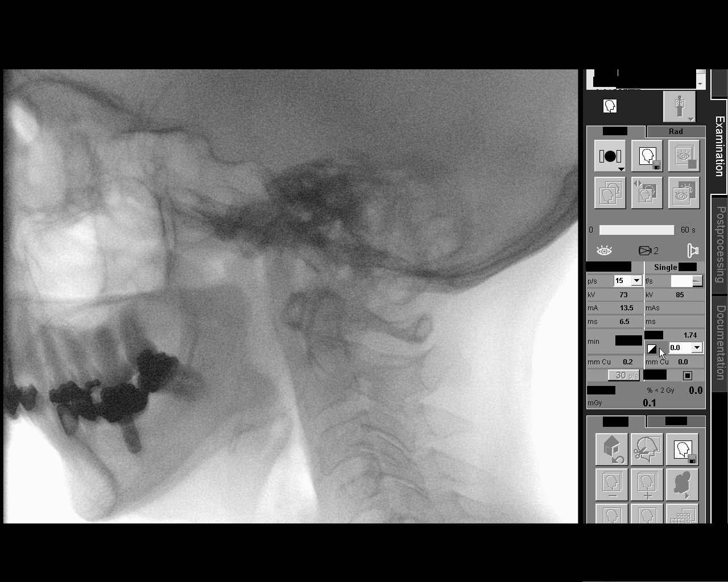

[Series 3: run · 1 of 350 frames shown (3 of 12)]
[frame 48/350]
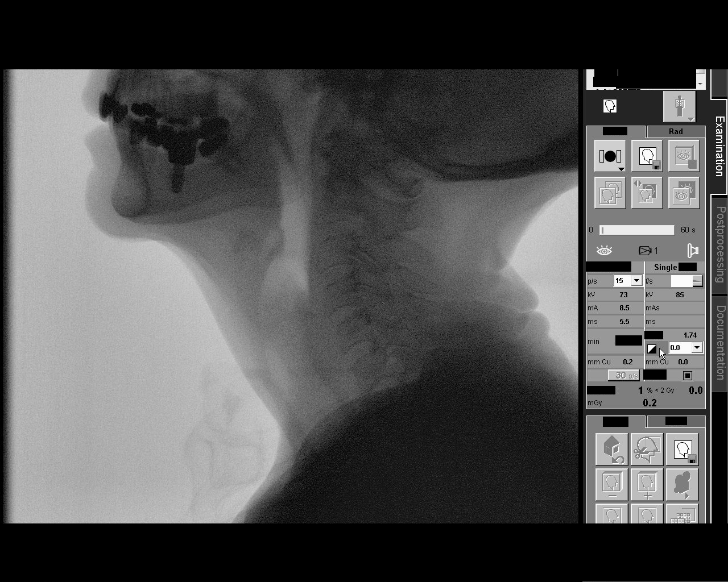

[Series 4: run · 1 of 156 frames shown (4 of 12)]
[frame 24/156]
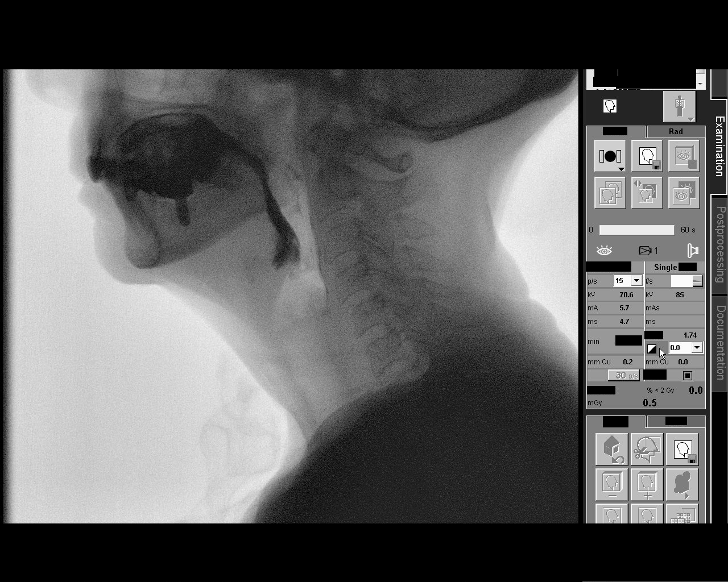

[Series 5: run · 1 of 254 frames shown (5 of 12)]
[frame 1/254]
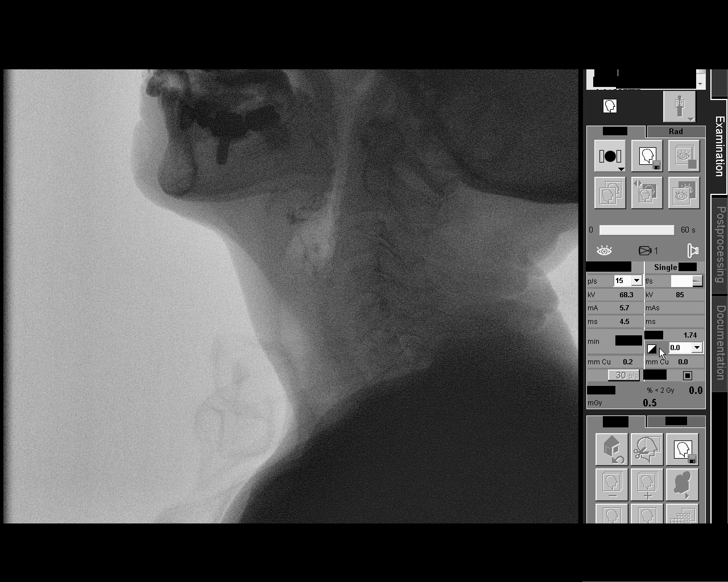

[Series 6: run · 1 of 314 frames shown (6 of 12)]
[frame 48/314]
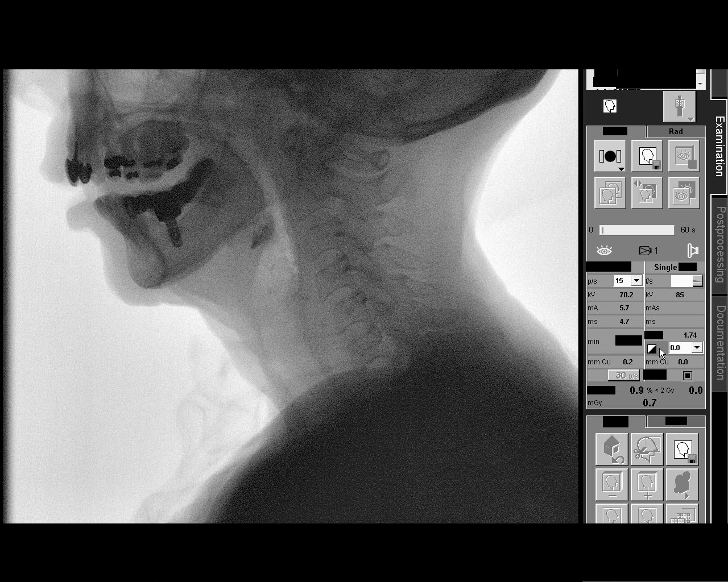

[Series 7: run · 2 of 244 frames shown (7 of 12)]
[frame 123/244]
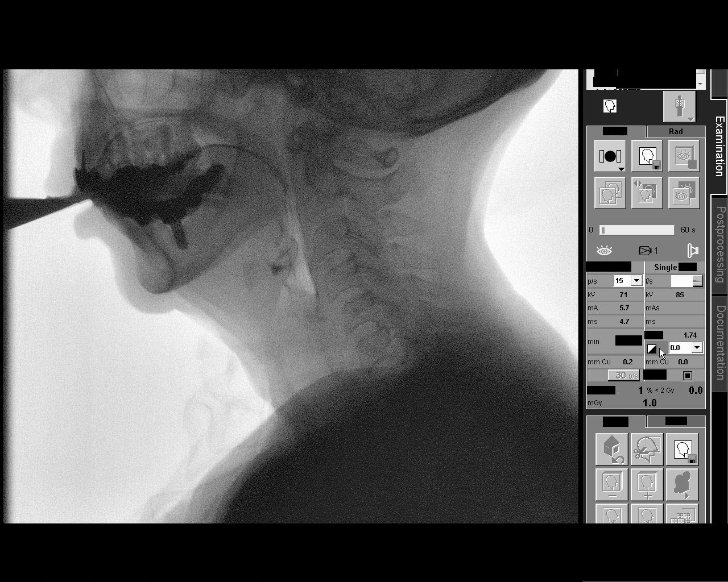
[frame 243/244]
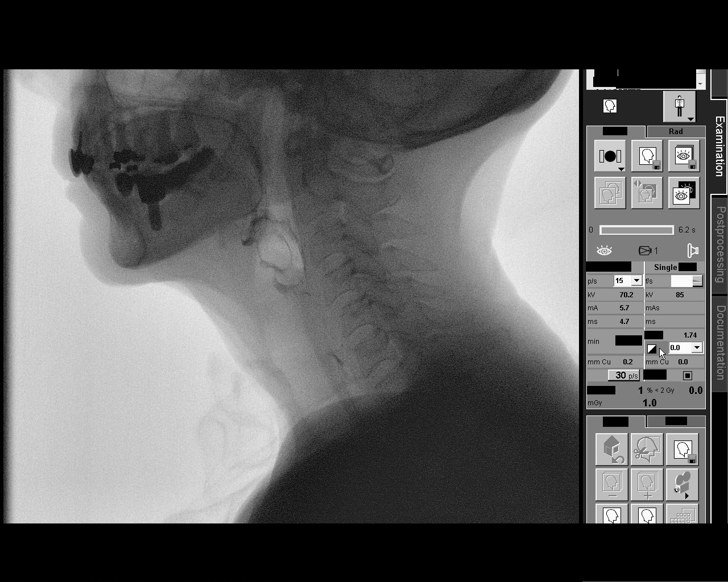

[Series 8: run · 1 of 13 frames shown (8 of 12)]
[frame 12/13]
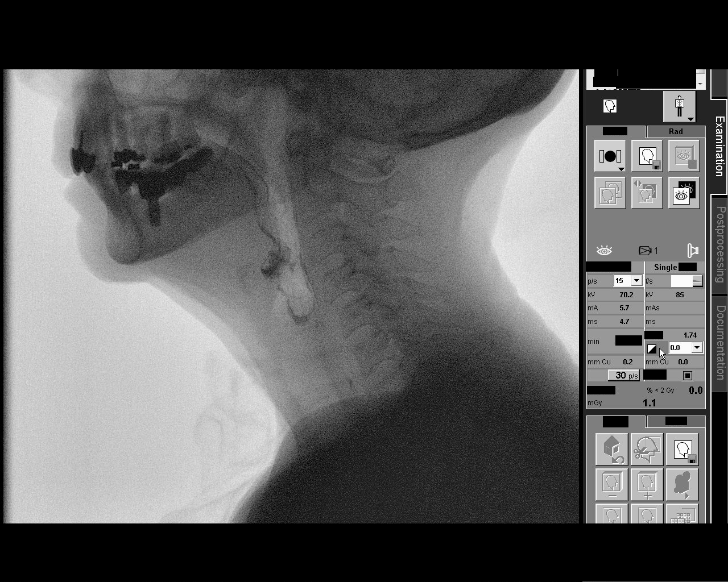

[Series 9: run · 1 of 261 frames shown (9 of 12)]
[frame 222/261]
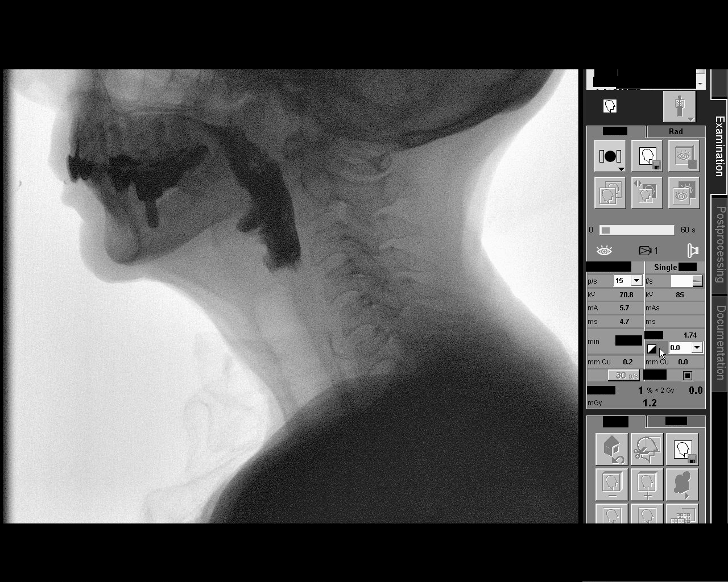

[Series 10: run · 1 of 278 frames shown (10 of 12)]
[frame 237/278]
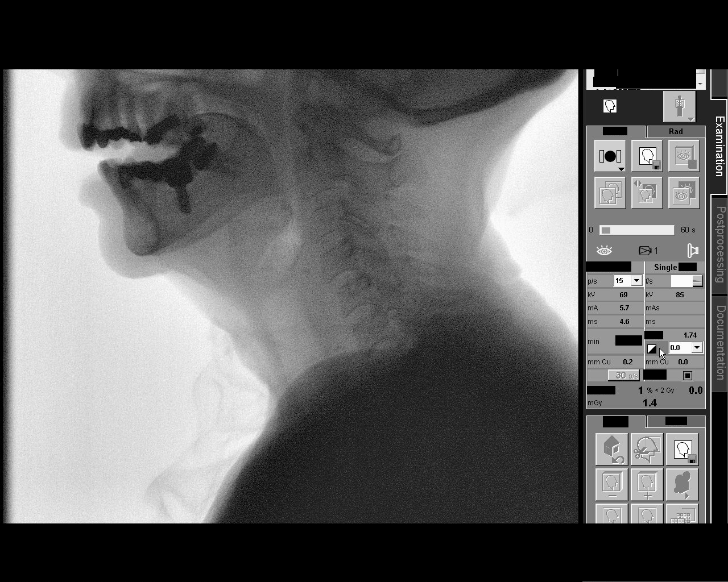

[Series 11: run · 1 of 127 frames shown (11 of 12)]
[frame 108/127]
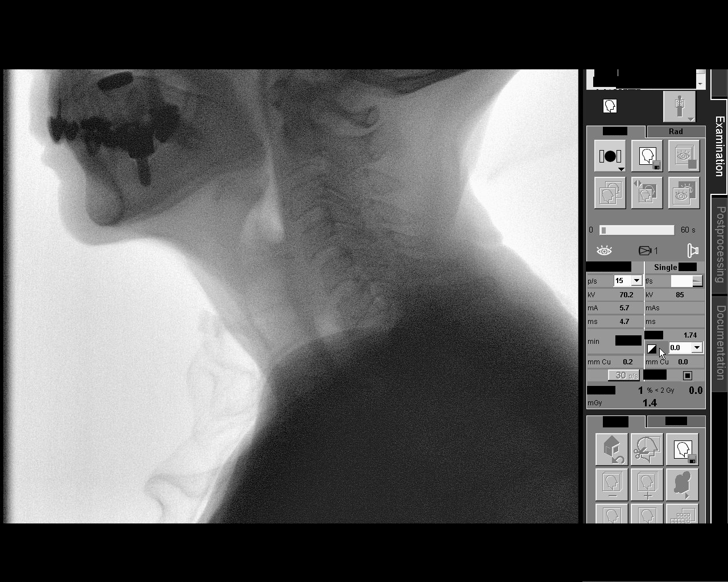

[Series 12: run · 1 of 83 frames shown (12 of 12)]
[frame 71/83]
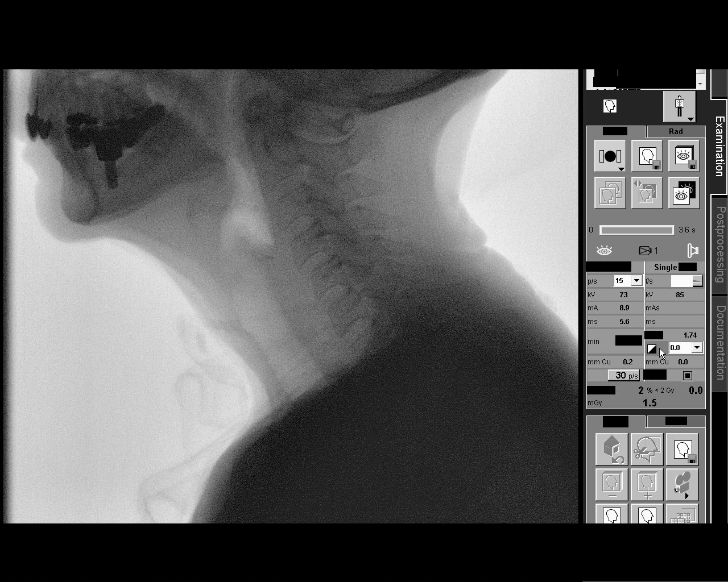

[13 of 24 positions shown; findings below may reference images not displayed]

FINDINGS: Thin liquid- within normal limits

Nectar thick liquid- within normal limits

Mhirry?Dianna within normal limits

Mhirry?Liera with cracker- within normal limits

Barium tablet - difficulty with water, no difficulty and applesauce.
Reference is made to the therapist report.
IMPRESSION: 1. Difficulty swallowing barium tablet with water, no difficulty
swallowing barium tablet without muscles.

2. Exam otherwise unremarkable.

Please refer to the Speech Pathologists report for complete details
and recommendations.

## 2017-04-04 ENCOUNTER — Encounter: Payer: Medicare HMO | Admitting: Obstetrics and Gynecology

## 2018-01-21 ENCOUNTER — Other Ambulatory Visit: Payer: Self-pay

## 2018-01-21 ENCOUNTER — Ambulatory Visit: Payer: Medicare HMO | Admitting: Gastroenterology

## 2018-01-21 ENCOUNTER — Encounter (INDEPENDENT_AMBULATORY_CARE_PROVIDER_SITE_OTHER): Payer: Self-pay

## 2018-01-21 ENCOUNTER — Encounter: Payer: Self-pay | Admitting: Gastroenterology

## 2018-01-21 VITALS — BP 133/56 | HR 64 | Ht 59.0 in | Wt 100.0 lb

## 2018-01-21 DIAGNOSIS — M810 Age-related osteoporosis without current pathological fracture: Secondary | ICD-10-CM | POA: Insufficient documentation

## 2018-01-21 DIAGNOSIS — K219 Gastro-esophageal reflux disease without esophagitis: Secondary | ICD-10-CM | POA: Diagnosis not present

## 2018-01-21 DIAGNOSIS — N189 Chronic kidney disease, unspecified: Secondary | ICD-10-CM | POA: Insufficient documentation

## 2018-01-21 NOTE — Progress Notes (Signed)
Gastroenterology Consultation  Referring Provider:     Vernie Murders, MD Primary Care Physician:  Mickey Farber, MD Primary Gastroenterologist:  Dr. Servando Snare     Reason for Consultation:     GERD        HPI:   Barbara Stewart is a 84 y.o. y/o female referred for consultation & management of GERD by Dr. Mickey Farber, MD.  This patient comes in today after being seen in the past by Dr. Mechele Collin at [facility] clinic, Dr. Council Mechanic at [facility] and most recently back at [facility] clinic by Mrs. London for Dr. Marva Panda. The patient was then seen by Dr. Vernie Murders who referred the patient to me. The patient has been diagnosed with burning mouth syndrome at [facility]. From her last visit in July of last year by Mrs. London she was reported to have globus. The patient has been found to have B12 deficiency and was reported in the past not to be compliant with B12 supplementation. The patient has also reportedly undergone a workup for this in the past including an upper endoscopy in 2014. The patient also had a barium swallow that showed mild reflux with a hiatal hernia. The patient does report that her main symptom is phlegm in the back of her throat and the inability to swallow it. She chews gum all the time to help her with the swallowing of the phlegm. She also states that she has burning at the tip of her tongue. She has been tried on Zantac and omeprazole and she states that this has not helped her symptoms in the past.  Past Medical History:  Diagnosis Date  . Atrophy, vulva   . GERD (gastroesophageal reflux disease)   . Hiatal hernia   . Insomnia   . Menopause   . Osteoporosis   . Tinnitus   . Vaginal atrophy     Past Surgical History:  Procedure Laterality Date  . CHOLECYSTECTOMY    . CHOLECYSTECTOMY    . EYE SURGERY     cataract  . TONSILLECTOMY AND ADENOIDECTOMY      Prior to Admission medications   Medication Sig Start Date End Date Taking? Authorizing Provider  Calcium Carbonate-Vitamin D  (CALCIUM-VITAMIN D) 500-200 MG-UNIT tablet Take by mouth.    [provider]  Cholecalciferol (VITAMIN D) 2000 units tablet Take by mouth.    [provider]  cyanocobalamin (V-R VITAMIN B-12) 500 MCG tablet Take by mouth.    [provider]  Digestive Enzymes (DIGESTIVE ENZYME PO) Take by mouth.    [provider]  fexofenadine (ALLEGRA) 180 MG tablet Take by mouth.    [provider]  ibuprofen (ADVIL,MOTRIN) 200 MG tablet Take 200 mg by mouth.    [provider]  Probiotic Product (PROBIOTIC PO) Take 1 capsule by mouth daily.    [provider]  ranitidine (ZANTAC) 150 MG tablet Take 150 mg by mouth 2 (two) times daily.    [provider]    Family History  Problem Relation Age of Onset  . Cirrhosis Mother   . Cirrhosis Father   . Diabetes Paternal Aunt        grt  . Cancer Neg Hx   . Heart disease Neg Hx   . Breast cancer Neg Hx      Social History   Tobacco Use  . Smoking status: Never Smoker  Substance Use Topics  . Alcohol use: Yes    Comment: ocas  . Drug use: No  Allergies as of 01/21/2018 - Review Complete 05/03/2016  Allergen Reaction Noted  . Aspirin Other (See Comments) 11/25/2015    Review of Systems:    All systems reviewed and negative except where noted in HPI.   Physical Exam:  There were no vitals taken for this visit. No LMP recorded. Patient is postmenopausal. Psych:  Alert and cooperative. Normal mood and affect. General:   Alert,  Well-developed, well-nourished, pleasant and cooperative in NAD Head:  Normocephalic and atraumatic. Eyes:  Sclera clear, no icterus.   Conjunctiva pink. Ears:  Normal auditory acuity. Nose:  No deformity, discharge, or lesions. Mouth:  No deformity or lesions,oropharynx pink & moist. Neck:  Supple; no masses or thyromegaly. Lungs:  Respirations even and unlabored.  Clear throughout to auscultation.   No wheezes, crackles, or rhonchi. No acute  distress. Heart:  Regular rate and rhythm; no murmurs, clicks, rubs, or gallops. Abdomen:  Normal bowel sounds.  No bruits.  Soft, non-tender and non-distended without masses, hepatosplenomegaly or hernias noted.  No guarding or rebound tenderness.  Negative Carnett sign.   Rectal:  Deferred.  Msk:  Symmetrical without gross deformities.  Good, equal movement & strength bilaterally. Pulses:  Normal pulses noted. Extremities:  No clubbing or edema.  No cyanosis. Neurologic:  Alert and oriented x3;  grossly normal neurologically. Skin:  Intact without significant lesions or rashes.  No jaundice. Lymph Nodes:  No significant cervical adenopathy. Psych:  Alert and cooperative. Normal mood and affect.  Imaging Studies: No results found.  Assessment and Plan:   Barbara Stewart is a 84 y.o. y/o female who comes in with a report of a thick feeling of phlegm in the back of her throat and it inability to swallow the phlegm. The patient also reports that her symptoms are slightly increased when she is outside gardening and thinks it may be when she bends over. The patient will be started on a trial of Dexilant 60 mg a day for 2 weeks to see if this solves her symptoms. If it does then she is likely having acid related issues. If it does not then I have told her that we will set her up for a 24-hour pH study with impedance to look for any signs of reflux as the cause of her symptoms. The patient and her daughter have been explained the plan and agree with it.  Midge Miniumarren Indica Marcott, MD. Clementeen GrahamFACG   Note: This dictation was prepared with Dragon dictation along with smaller phrase technology. Any transcriptional errors that result from this process are unintentional.

## 2018-09-30 ENCOUNTER — Encounter (INDEPENDENT_AMBULATORY_CARE_PROVIDER_SITE_OTHER): Payer: Self-pay | Admitting: Vascular Surgery

## 2018-09-30 ENCOUNTER — Ambulatory Visit (INDEPENDENT_AMBULATORY_CARE_PROVIDER_SITE_OTHER): Payer: Medicare HMO | Admitting: Vascular Surgery

## 2018-09-30 VITALS — BP 145/66 | HR 70 | Resp 16 | Ht 58.5 in | Wt 102.0 lb

## 2018-09-30 DIAGNOSIS — K219 Gastro-esophageal reflux disease without esophagitis: Secondary | ICD-10-CM

## 2018-09-30 DIAGNOSIS — I872 Venous insufficiency (chronic) (peripheral): Secondary | ICD-10-CM | POA: Diagnosis not present

## 2018-09-30 DIAGNOSIS — M1610 Unilateral primary osteoarthritis, unspecified hip: Secondary | ICD-10-CM

## 2018-09-30 DIAGNOSIS — I8312 Varicose veins of left lower extremity with inflammation: Secondary | ICD-10-CM

## 2018-09-30 DIAGNOSIS — R200 Anesthesia of skin: Secondary | ICD-10-CM | POA: Diagnosis not present

## 2018-09-30 DIAGNOSIS — I8311 Varicose veins of right lower extremity with inflammation: Secondary | ICD-10-CM

## 2018-09-30 DIAGNOSIS — R202 Paresthesia of skin: Secondary | ICD-10-CM

## 2018-10-02 ENCOUNTER — Encounter (INDEPENDENT_AMBULATORY_CARE_PROVIDER_SITE_OTHER): Payer: Self-pay | Admitting: Vascular Surgery

## 2018-10-02 DIAGNOSIS — I8311 Varicose veins of right lower extremity with inflammation: Secondary | ICD-10-CM | POA: Insufficient documentation

## 2018-10-02 DIAGNOSIS — I872 Venous insufficiency (chronic) (peripheral): Secondary | ICD-10-CM | POA: Insufficient documentation

## 2018-10-02 DIAGNOSIS — I8312 Varicose veins of left lower extremity with inflammation: Secondary | ICD-10-CM

## 2018-10-02 NOTE — Progress Notes (Signed)
MRN : [id number]  Barbara Stewart is a 84 y.o. (08/08/1934) female who presents with chief complaint of  Chief Complaint  Patient presents with  . New Patient (Initial Visit)    Venous insuffiency   .  History of Present Illness:   The patient is seen for evaluation of symptomatic varicose veins. The patient relates burning and numbness which worsened steadily throughout the course of the day, particularly with standing. The left leg is much worse than the right.  The patient also notes an aching and throbbing pain over the varicosities, particularly with prolonged dependent positions. The symptoms are significantly improved with elevation.  The patient also notes that during hot weather the symptoms are greatly intensified. The patient states the pain from the varicose veins interferes with work, daily exercise, shopping and household maintenance. At this point, the symptoms are persistent and severe enough that they're having a negative impact on lifestyle and are interfering with daily activities.  There is no history of DVT, PE or superficial thrombophlebitis. There is no history of ulceration or hemorrhage. The patient denies a significant family history of varicose veins.  The patient has not worn graduated compression in the past. At the present time the patient has not been using over-the-counter analgesics. There is no history of prior surgical intervention or sclerotherapy.    Current Meds  Medication Sig  . Digestive Enzymes (DIGESTIVE ENZYME PO) Take by mouth.  . diphenhydrAMINE (BENADRYL) 25 mg capsule Take by mouth.  . fexofenadine (ALLEGRA) 180 MG tablet Take by mouth.  . Probiotic Product (PROBIOTIC PO) Take 1 capsule by mouth daily.    Past Medical History:  Diagnosis Date  . Atrophy, vulva   . GERD (gastroesophageal reflux disease)   . Hiatal hernia   . Insomnia   . Menopause   . Osteoporosis   . Tinnitus   . Vaginal atrophy     Past Surgical History:    Procedure Laterality Date  . CHOLECYSTECTOMY    . CHOLECYSTECTOMY    . EYE SURGERY     cataract  . TONSILLECTOMY AND ADENOIDECTOMY      Social History Social History   Tobacco Use  . Smoking status: Never Smoker  . Smokeless tobacco: Never Used  Substance Use Topics  . Alcohol use: Yes    Comment: ocas  . Drug use: No    Family History Family History  Problem Relation Age of Onset  . Cirrhosis Mother   . Cirrhosis Father   . Diabetes Paternal Aunt        grt  . Cancer Neg Hx   . Heart disease Neg Hx   . Breast cancer Neg Hx   No family history of bleeding/clotting disorders, porphyria or autoimmune disease   Allergies  Allergen Reactions  . Aspirin Other (See Comments)    Reaction: Burning in stomach Pt states she doesn't tolerate it well.     REVIEW OF SYSTEMS (Negative unless checked)  Constitutional: [] Weight loss  [] Fever  [] Chills Cardiac: [] Chest pain   [] Chest pressure   [] Palpitations   [] Shortness of breath when laying flat   [] Shortness of breath with exertion. Vascular:  [] Pain in legs with walking   [x] Pain in legs at rest  [] History of DVT   [] Phlebitis   [x] Swelling in legs   [x] Varicose veins   [] Non-healing ulcers Pulmonary:   [] Uses home oxygen   [] Productive cough   [] Hemoptysis   [] Wheeze  [] COPD   [] Asthma Neurologic:  [] Dizziness   []   Seizures   [] History of stroke   [] History of TIA  [] Aphasia   [] Vissual changes   [] Weakness or numbness in arm   [] Weakness or numbness in leg Musculoskeletal:   [] Joint swelling   [x] Joint pain   [x] Low back pain Hematologic:  [] Easy bruising  [] Easy bleeding   [] Hypercoagulable state   [] Anemic Gastrointestinal:  [] Diarrhea   [] Vomiting  [] Gastroesophageal reflux/heartburn   [] Difficulty swallowing. Genitourinary:  [] Chronic kidney disease   [] Difficult urination  [] Frequent urination   [] Blood in urine Skin:  [] Rashes   [] Ulcers  Psychological:  [] History of anxiety   []  History of major  depression.  Physical Examination  Vitals:   09/30/18 1104  BP: (!) 145/66  Pulse: 70  Resp: 16  Weight: 102 lb (46.3 kg)  Height: 4' 10.5" (1.486 m)   Body mass index is 20.96 kg/m. Gen: WD/WN, NAD Head: [place]/AT, No temporalis wasting.  Ear/Nose/Throat: Hearing grossly intact, nares w/o erythema or drainage, poor dentition Eyes: PER, EOMI, sclera nonicteric.  Neck: Supple, no masses.  No bruit or JVD.  Pulmonary:  Good air movement, clear to auscultation bilaterally, no use of accessory muscles.  Cardiac: RRR, normal S1, S2, no Murmurs. Vascular: scattered varicosities present bilaterally.  Mild venous stasis changes to the legs bilaterally.  2+ soft pitting edema Vessel Right Left  Radial Palpable Palpable  PT Palpable Palpable  DP Palpable Palpable  Gastrointestinal: soft, non-distended. No guarding/no peritoneal signs.  Musculoskeletal: M/S 5/5 throughout.  No deformity or atrophy.  Neurologic: CN 2-12 intact. Pain and light touch intact in extremities.  Symmetrical.  Speech is fluent. Motor exam as listed above. Psychiatric: Judgment intact, Mood & affect appropriate for pt's clinical situation. Dermatologic: mild venous rashes no ulcers noted.  No changes consistent with cellulitis. Lymph : No Cervical lymphadenopathy, no lichenification or skin changes of chronic lymphedema.  CBC Lab Results  Component Value Date   WBC 6.8 11/25/2015   HGB 13.9 11/25/2015   HCT 41.5 11/25/2015   MCV 92.2 11/25/2015   PLT 190 11/25/2015    BMET    Component Value Date/Time   NA 139 11/25/2015 1302   NA 137 08/10/2013 1129   K 4.1 11/25/2015 1302   K 3.7 08/10/2013 1129   CL 106 11/25/2015 1302   CL 104 08/10/2013 1129   CO2 26 11/25/2015 1302   CO2 27 08/10/2013 1129   GLUCOSE 94 11/25/2015 1302   GLUCOSE 106 (H) 08/10/2013 1129   BUN 16 11/25/2015 1302   BUN 11 08/10/2013 1129   CREATININE 0.53 11/25/2015 1302   CREATININE 0.72 08/10/2013 1129   CALCIUM 8.8 (L)  11/25/2015 1302   CALCIUM 9.4 08/10/2013 1129   GFRNONAA >60 11/25/2015 1302   GFRNONAA >60 08/10/2013 1129   GFRAA >60 11/25/2015 1302   GFRAA >60 08/10/2013 1129   CrCl cannot be calculated (Patient's most recent lab result is older than the maximum 21 days allowed.).  COAG Lab Results  Component Value Date   INR 1.04 11/25/2015    Radiology No results found.   Assessment/Plan 1. Varicose veins of both lower extremities with inflammation Recommend:  The patient is complaining of varicose veins.    I have had a long discussion with the patient regarding  varicose veins and why they cause symptoms.  Patient will begin wearing graduated compression stockings on a daily basis, beginning first thing in the morning and removing them in the evening. The patient is instructed specifically not to sleep in the stockings.  The patient  will also begin using over-the-counter analgesics such as Motrin 600 mg po TID to help control the symptoms as needed.    In addition, behavioral modification including elevation during the day will be initiated, utilizing a recliner was recommended.  The patient is also instructed to continue exercising such as walking 4-5 times per week.  At this time the patient wishes to continue conservative therapy and is not interested in more invasive treatments such as laser ablation and sclerotherapy.  The Patient will follow up PRN if the symptoms worsen.  2. Chronic venous insufficiency Recommend:  The patient is complaining of varicose veins.    I have had a long discussion with the patient regarding  varicose veins and why they cause symptoms.  Patient will begin wearing graduated compression stockings on a daily basis, beginning first thing in the morning and removing them in the evening. The patient is instructed specifically not to sleep in the stockings.    The patient  will also begin using over-the-counter analgesics such as Motrin 600 mg po TID  to help control the symptoms as needed.    In addition, behavioral modification including elevation during the day will be initiated, utilizing a recliner was recommended.  The patient is also instructed to continue exercising such as walking 4-5 times per week.  At this time the patient wishes to continue conservative therapy and is not interested in more invasive treatments such as laser ablation and sclerotherapy.  The Patient will follow up PRN if the symptoms worsen.  3. Numbness and tingling of left leg See #1 & 2  4. Gastro-esophageal reflux disease without esophagitis Continue PPI as already ordered, this medication has been reviewed and there are no changes at this time.  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  Continued activity and therapy was stressed.     Levora DredgeGregory , MD  10/02/2018 8:37 PM

## 2020-03-01 ENCOUNTER — Other Ambulatory Visit: Payer: Self-pay | Admitting: Certified Nurse Midwife

## 2020-03-01 DIAGNOSIS — Z1231 Encounter for screening mammogram for malignant neoplasm of breast: Secondary | ICD-10-CM

## 2020-03-15 ENCOUNTER — Ambulatory Visit
Admission: RE | Admit: 2020-03-15 | Discharge: 2020-03-15 | Disposition: A | Discharge status: Home / Self Care | Payer: Medicare HMO | Source: Ambulatory Visit | Attending: Certified Nurse Midwife | Admitting: Certified Nurse Midwife

## 2020-03-15 ENCOUNTER — Encounter (INDEPENDENT_AMBULATORY_CARE_PROVIDER_SITE_OTHER): Payer: Self-pay

## 2020-03-15 ENCOUNTER — Other Ambulatory Visit: Payer: Self-pay

## 2020-03-15 DIAGNOSIS — Z1231 Encounter for screening mammogram for malignant neoplasm of breast: Secondary | ICD-10-CM | POA: Insufficient documentation

## 2024-02-05 ENCOUNTER — Ambulatory Visit (INDEPENDENT_AMBULATORY_CARE_PROVIDER_SITE_OTHER)

## 2024-02-05 ENCOUNTER — Encounter: Payer: Self-pay | Admitting: Emergency Medicine

## 2024-02-05 ENCOUNTER — Ambulatory Visit
Admission: EM | Admit: 2024-02-05 | Discharge: 2024-02-05 | Disposition: A | Discharge status: Home / Self Care | Attending: Emergency Medicine | Admitting: Emergency Medicine

## 2024-02-05 DIAGNOSIS — W19XXXA Unspecified fall, initial encounter: Secondary | ICD-10-CM | POA: Diagnosis not present

## 2024-02-05 DIAGNOSIS — M25561 Pain in right knee: Secondary | ICD-10-CM

## 2024-02-05 NOTE — Discharge Instructions (Signed)
 Ice or heat, Tylenol 500 mg 3-4 times a day if she starts to have pain not controlled with the DoTerra deep blue rub.  Follow-up with [facility] if symptoms do not resolve in a week, or if she gets worse.

## 2024-02-05 NOTE — ED Triage Notes (Signed)
 Pt presents with right knee pain x 1 week. Pt is with her daughter who states she tripped at home twice last week.

## 2024-02-05 NOTE — ED Provider Notes (Signed)
 HPI  SUBJECTIVE:  Barbara Stewart is a 89 y.o. female who presents with 2 unwitnessed falls over the past week.  Daughter states that the patient remembers running into the kitchen, tripping and falling onto her knees on the carpet the first time, but does not remember the second fall 4 days ago.  Daughter states that she found the patient on her knees in her carpeted bedroom.  She was able to walk immediately after this.  Daughter states that the patient has been having issues with balance recently.  Patient denies hitting her head, no reports of headache, nausea, vomiting, neck pain, altered mental status.  She reports mild right anterior knee pain present with going from sitting/standing and standing/sitting.  No bruising, swelling, popping, giving way, limitation of motion.  Patient has been able to walk without any problem.  Daughter has been applying deep blue DoTerra essential oils with improvement in her symptoms.  Symptoms are worse with getting up/sitting down.  The patient has a past medical history of dementia, hypertension osteoporosis, chronic venous insufficiency.  No history of chronic kidney disease.  PCP: [facility].  All history obtained through daughter acting as interpreter, and from the daughter.  Patient has dementia.  Past Medical History:  Diagnosis Date   Atrophy, vulva    GERD (gastroesophageal reflux disease)    Hiatal hernia    Insomnia    Menopause    Osteoporosis    Tinnitus    Vaginal atrophy     Past Surgical History:  Procedure Laterality Date   CHOLECYSTECTOMY     CHOLECYSTECTOMY     EYE SURGERY     cataract   TONSILLECTOMY AND ADENOIDECTOMY      Family History  Problem Relation Age of Onset   Cirrhosis Mother    Cirrhosis Father    Diabetes Paternal Aunt        grt   Cancer Neg Hx    Heart disease Neg Hx    Breast cancer Neg Hx     Social History   Tobacco Use   Smoking status: Never   Smokeless tobacco: Never  Vaping Use   Vaping  status: Never Used  Substance Use Topics   Alcohol use: Yes    Comment: ocas   Drug use: No    No current facility-administered medications for this encounter.  Current Outpatient Medications:    Calcium Carbonate-Vitamin D (CALCIUM-VITAMIN D) 500-200 MG-UNIT tablet, Take by mouth., Disp: , Rfl:    Cholecalciferol (VITAMIN D) 2000 units tablet, Take by mouth., Disp: , Rfl:    cyanocobalamin (V-R VITAMIN B-12) 500 MCG tablet, Take by mouth., Disp: , Rfl:    Digestive Enzymes (DIGESTIVE ENZYME PO), Take by mouth., Disp: , Rfl:    diphenhydrAMINE (BENADRYL) 25 mg capsule, Take by mouth., Disp: , Rfl:    fexofenadine (ALLEGRA) 180 MG tablet, Take by mouth., Disp: , Rfl:    fluocinonide (LIDEX) 0.05 % external solution, APPLY TO SCALP TWICE DAILY FOR 1 - 2 WEEKS AS NEEDED AVOID FACE/FOLDS, Disp: , Rfl:    Probiotic Product (PROBIOTIC PO), Take 1 capsule by mouth daily., Disp: , Rfl:    ranitidine (ZANTAC) 150 MG tablet, Take 150 mg by mouth 2 (two) times daily., Disp: , Rfl:   Allergies  Allergen Reactions   Aspirin Other (See Comments)    Reaction: Burning in stomach Pt states she doesn't tolerate it well.     ROS  As noted in HPI.   Physical Exam  BP (!) 170/86 (BP Location: Right Arm)   Pulse 71   Temp 97.8 F (36.6 C) (Oral)   Resp 18   SpO2 100%   Constitutional: Well developed, well nourished, no acute distress Eyes: PERRL, EOMI, conjunctiva normal bilaterally HENT: Normocephalic, atraumatic,mucus membranes moist Respiratory: Clear to auscultation bilaterally, no rales, no wheezing, no rhonchi Cardiovascular: Normal rate and rhythm, no murmurs, no gallops, no rubs GI: Soft, nondistended, normal bowel sounds, nontender, no rebound, no guarding Back: no CVAT skin: No rash, skin intact Musculoskeletal: No edema R  Knee ROM baseline for Pt, Flexion/extension against resistance intact, point tenderness along medial tibial plateau.  no bruising or swelling. Patella NT,   Patellar tendon NT, Medial joint NT , Lateral joint NT , Popliteal region NT, Varus MCL stress testing stable, Valgus LCL stress testing stable, ACL/PCL stable, McMurray negative,  Distal NVI with intact baseline sensation / motor / pulse distal to knee.  No effusion. No erythema. No increased temperature. No crepitus.   Neurologic: Alert & oriented x 3, CN III-XII grossly intact, no motor deficits, sensation grossly intact Psychiatric: Speech and behavior appropriate   ED Course   Medications - No data to display  Orders Placed This Encounter  Procedures   DG Knee Complete 4 Views Right    Standing Status:   Standing    Number of Occurrences:   1    Reason for Exam (SYMPTOM  OR DIAGNOSIS REQUIRED):   Fall   No results found for this or any previous visit (from the past 24 hours). DG Knee Complete 4 Views Right Result Date: 02/05/2024 CLINICAL DATA:  Fall, right knee pain EXAM: RIGHT KNEE - COMPLETE 4+ VIEW COMPARISON:  None available FINDINGS: Early joint space narrowing and spurring in the patellofemoral compartment. Small joint effusion. No acute bony abnormality. Specifically, no fracture, subluxation, or dislocation. 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 No acute bony abnormality. Electronically Signed   By: Charlett Nose M.D.   On: 02/05/2024 20:30    ED Clinical Impression  1. Acute pain of right knee   2. Fall, initial encounter      ED Assessment/Plan     Patient presents with knee pain after having two unwitnessed falls the past week.  Daughter states that the patient is currently at her baseline mental status and has no complaints of headache or neck pain.  Doubt serious intracranial pathology at this time.  Reviewed imaging independently.  No fracture, dislocation as read by me.  Formal radiology overread pending.  Discussed with daughter Rachel Bo, will call her at [phone] if the radiology overread  differs enough from my read and we need to change management.  Patient has not required any pain medication.  Daughter has been applying essential oils with improvement in her symptoms.  Advised ice, heat, Tylenol 500 mg 3 or 4 times a day if she starts to have pain, follow-up withorthopedics as needed.  ER return precautions given.  Reviewed radiology report, no fracture or dislocation consistent with my read. See radiology report for full details.  Called daughter and notified her of negative knee x-rays.  Discussed imaging, MDM, treatment plan, and plan for follow-up with family. Discussed sn/sx that should prompt return to the ED. family agrees with plan.   No orders of the defined types were placed in this encounter.     *This clinic note was created using Dragon dictation software. Therefore, there may be occasional mistakes despite careful proofreading. ?  Domenick Gong, MD 02/07/24 [phone]

## 2024-04-07 ENCOUNTER — Emergency Department
Admission: EM | Admit: 2024-04-07 | Discharge: 2024-04-08 | Disposition: A | Discharge status: Home / Self Care | Attending: Emergency Medicine | Admitting: Emergency Medicine

## 2024-04-07 ENCOUNTER — Other Ambulatory Visit: Payer: Self-pay

## 2024-04-07 DIAGNOSIS — F0390 Unspecified dementia without behavioral disturbance: Secondary | ICD-10-CM | POA: Insufficient documentation

## 2024-04-07 DIAGNOSIS — R300 Dysuria: Secondary | ICD-10-CM

## 2024-04-07 DIAGNOSIS — N390 Urinary tract infection, site not specified: Secondary | ICD-10-CM | POA: Diagnosis not present

## 2024-04-07 LAB — URINALYSIS, ROUTINE W REFLEX MICROSCOPIC
Bacteria, UA: NONE SEEN
Bilirubin Urine: NEGATIVE
Glucose, UA: NEGATIVE mg/dL
Ketones, ur: NEGATIVE mg/dL
Nitrite: NEGATIVE
Protein, ur: 30 mg/dL — AB
Specific Gravity, Urine: 1.018 (ref 1.005–1.030)
pH: 5 (ref 5.0–8.0)

## 2024-04-07 LAB — CBC
HCT: 35.8 % — ABNORMAL LOW (ref 36.0–46.0)
Hemoglobin: 11.8 g/dL — ABNORMAL LOW (ref 12.0–15.0)
MCH: 30.5 pg (ref 26.0–34.0)
MCHC: 33 g/dL (ref 30.0–36.0)
MCV: 92.5 fL (ref 80.0–100.0)
Platelets: 196 10*3/uL (ref 150–400)
RBC: 3.87 MIL/uL (ref 3.87–5.11)
RDW: 13 % (ref 11.5–15.5)
WBC: 8.2 10*3/uL (ref 4.0–10.5)
nRBC: 0 % (ref 0.0–0.2)

## 2024-04-07 LAB — BASIC METABOLIC PANEL WITH GFR
Anion gap: 6 (ref 5–15)
BUN: 18 mg/dL (ref 8–23)
CO2: 26 mmol/L (ref 22–32)
Calcium: 9.1 mg/dL (ref 8.9–10.3)
Chloride: 106 mmol/L (ref 98–111)
Creatinine, Ser: 1.23 mg/dL — ABNORMAL HIGH (ref 0.44–1.00)
GFR, Estimated: 43 mL/min — ABNORMAL LOW (ref >60)
Glucose, Bld: 103 mg/dL — ABNORMAL HIGH (ref 70–99)
Potassium: 3.7 mmol/L (ref 3.5–5.1)
Sodium: 138 mmol/L (ref 135–145)

## 2024-04-07 MED ORDER — CEFUROXIME AXETIL 250 MG PO TABS: 250.0000 mg | ORAL_TABLET | Freq: Two times a day (BID) | ORAL | 0 refills | Status: AC

## 2024-04-07 MED ORDER — CEFUROXIME AXETIL 250 MG PO TABS
250.0000 mg | ORAL_TABLET | Freq: Once | ORAL | Status: AC
  Administered 2024-04-07: 250 mg via ORAL
  Filled 2024-04-07: qty 1

## 2024-04-07 NOTE — ED Notes (Signed)
 Pt complaining of pain in arm while taking BP. First BP was unable to take and pt did not want to try a second time.

## 2024-04-07 NOTE — ED Provider Notes (Signed)
 [facility] Provider Note   Event Date/Time   First MD Initiated Contact with Patient 04/07/24 2052     (approximate) History  Dysuria  HPI Barbara HEMBERGER is a 89 y.o. female with a past medical history of dementia who presents with her caregiver concern for possible urinary tract infection as patient has been complaining of dysuria over the past 3 days.  Patient has been using a bucket at her bedside and feels that this is likely because she does not feel as though she can make it to the bathroom.  Patient did take a home urinary tract infection test that has come back positive ROS: Patient currently denies any vision changes, tinnitus, difficulty speaking, facial droop, sore throat, chest pain, shortness of breath, abdominal pain, nausea/vomiting/diarrhea, or weakness/numbness/paresthesias in any extremity   Physical Exam  Triage Vital Signs: ED Triage Vitals [04/07/24 2053]  Encounter Vitals Group     BP (!) 155/54     Systolic BP Percentile      Diastolic BP Percentile      Pulse Rate 74     Resp 18     Temp 98.3 F (36.8 C)     Temp Source Oral     SpO2 100 %     Weight      Height      Head Circumference      Peak Flow      Pain Score      Pain Loc      Pain Education      Exclude from Growth Chart    Most recent vital signs: Vitals:   04/07/24 2230 04/07/24 2342  BP:    Pulse: 74 66  Resp:  18  Temp:  98.1 F (36.7 C)  SpO2: 99% 98%   General: Awake, cooperative CV:  Good peripheral perfusion.  Resp:  Normal effort.  Abd:  No distention.  Other:  Elderly well-developed, well-nourished Hispanic female resting comfortably in no acute distress ED Results / Procedures / Treatments  Labs (all labs ordered are listed, but only abnormal results are displayed) Labs Reviewed  URINALYSIS, ROUTINE W REFLEX MICROSCOPIC - Abnormal; Notable for the following components:      Result Value   Color, Urine YELLOW (*)    APPearance HAZY (*)    Hgb  urine dipstick SMALL (*)    Protein, ur 30 (*)    Leukocytes,Ua TRACE (*)    All other components within normal limits  BASIC METABOLIC PANEL WITH GFR - Abnormal; Notable for the following components:   Glucose, Bld 103 (*)    Creatinine, Ser 1.23 (*)    GFR, Estimated 43 (*)    All other components within normal limits  CBC - Abnormal; Notable for the following components:   Hemoglobin 11.8 (*)    HCT 35.8 (*)    All other components within normal limits  PROCEDURES: Critical Care performed: No Procedures MEDICATIONS ORDERED IN ED: Medications  cefUROXime  (CEFTIN ) tablet 250 mg (250 mg Oral Given 04/07/24 2337)   IMPRESSION / MDM / ASSESSMENT AND PLAN / ED COURSE  I reviewed the triage vital signs and the nursing notes.                             The patient is on the cardiac monitor to evaluate for evidence of arrhythmia and/or significant heart rate changes. Patient's presentation is most consistent with acute presentation with  potential threat to life or bodily function. 89 year old female presents for dysuria and positive urinary tract infection testing at home Not Pregnant. Unlikely TOA, Ovarian Torsion, PID, gonorrhea/chlamydia. Low suspicion for Infected Urolithiasis, AAA, Cholecystitis, Pancreatitis, SBO, Appendicitis, or other acute abdomen.  Rx: Ceftin  250 mg BID for 5 days Disposition: Discharge home. SRP discussed. Advise follow up with primary care provider within 24-72 hours.   FINAL CLINICAL IMPRESSION(S) / ED DIAGNOSES   Final diagnoses:  Dysuria  Urinary tract infection without hematuria, site unspecified   Rx / DC Orders   ED Discharge Orders          Ordered    cefUROXime  (CEFTIN ) 250 MG tablet  2 times daily with meals        04/07/24 2234           Note:  This document was prepared using Dragon voice recognition software and may include unintentional dictation errors.   Danialle Dement K, MD 04/08/24 [phone]

## 2024-04-07 NOTE — ED Triage Notes (Addendum)
 Pt arrives via EMS from home for possible UTI according to family. Family stated they did an at home test for UTI and it came back positive. Pt hasa history of dementia. Pt has been peeing in a bucket at bedside and family does not know if its because she can't make it to the bathroom or something else.  EMS vitals: 138/70 BP 98.6 F

## 2024-07-22 ENCOUNTER — Ambulatory Visit (LOCAL_COMMUNITY_HEALTH_CENTER)

## 2024-07-22 DIAGNOSIS — Z111 Encounter for screening for respiratory tuberculosis: Secondary | ICD-10-CM

## 2024-07-22 NOTE — Progress Notes (Signed)
 In clinic for PPD for care facility placement. Daughter understands and speaks English; able to interpret during this visit. Patient born in [place]; daughter unsure of BCG status. Consulted with JINNY Leghorn, MD who states BCG is required in [place] and gives okay for QFT lab (free of charge). Patient agrees. ROI signed and sent for scanning. Patient walked to lab for venipuncture.   Doyce CINDERELLA Shuck, RN

## 2024-07-24 ENCOUNTER — Ambulatory Visit: Payer: Self-pay

## 2024-07-24 LAB — QUANTIFERON-TB GOLD PLUS
QuantiFERON Mitogen Value: 9.93 [IU]/mL
QuantiFERON Nil Value: 0.08 [IU]/mL
QuantiFERON TB1 Ag Value: 0.07 [IU]/mL
QuantiFERON TB2 Ag Value: 0.09 [IU]/mL
QuantiFERON-TB Gold Plus: NEGATIVE

## 2024-07-24 NOTE — Progress Notes (Signed)
 Phone call placed to daughter Kemp Roche) regarding results. No answer. LVM to call back. Barbara CINDERELLA Shuck, RN

## 2024-07-25 ENCOUNTER — Other Ambulatory Visit

## 2024-07-25 NOTE — Progress Notes (Signed)
 Per her request, Copy of QFT results was provided to daughter Kemp Roche) via secure email  Amadeo Pines, RN

## 2024-09-22 ENCOUNTER — Emergency Department

## 2024-09-22 ENCOUNTER — Other Ambulatory Visit: Payer: Self-pay

## 2024-09-22 ENCOUNTER — Encounter: Payer: Self-pay | Admitting: Emergency Medicine

## 2024-09-22 ENCOUNTER — Emergency Department
Admission: EM | Admit: 2024-09-22 | Discharge: 2024-09-22 | Disposition: A | Discharge status: Skilled Nursing Facility | Attending: Emergency Medicine | Admitting: Emergency Medicine

## 2024-09-22 DIAGNOSIS — F0390 Unspecified dementia without behavioral disturbance: Secondary | ICD-10-CM | POA: Diagnosis not present

## 2024-09-22 DIAGNOSIS — W19XXXA Unspecified fall, initial encounter: Secondary | ICD-10-CM | POA: Insufficient documentation

## 2024-09-22 DIAGNOSIS — S0101XA Laceration without foreign body of scalp, initial encounter: Secondary | ICD-10-CM | POA: Diagnosis not present

## 2024-09-22 DIAGNOSIS — S0990XA Unspecified injury of head, initial encounter: Secondary | ICD-10-CM | POA: Diagnosis present

## 2024-09-22 LAB — BASIC METABOLIC PANEL WITH GFR
Anion gap: 9 (ref 5–15)
BUN: 22 mg/dL (ref 8–23)
CO2: 26 mmol/L (ref 22–32)
Calcium: 8.9 mg/dL (ref 8.9–10.3)
Chloride: 103 mmol/L (ref 98–111)
Creatinine, Ser: 1.03 mg/dL — ABNORMAL HIGH (ref 0.44–1.00)
GFR, Estimated: 53 mL/min — ABNORMAL LOW (ref >60)
Glucose, Bld: 102 mg/dL — ABNORMAL HIGH (ref 70–99)
Potassium: 4.1 mmol/L (ref 3.5–5.1)
Sodium: 138 mmol/L (ref 135–145)

## 2024-09-22 LAB — CBC
HCT: 36.5 % (ref 36.0–46.0)
Hemoglobin: 11.8 g/dL — ABNORMAL LOW (ref 12.0–15.0)
MCH: 30.2 pg (ref 26.0–34.0)
MCHC: 32.3 g/dL (ref 30.0–36.0)
MCV: 93.4 fL (ref 80.0–100.0)
Platelets: 275 K/uL (ref 150–400)
RBC: 3.91 MIL/uL (ref 3.87–5.11)
RDW: 13.6 % (ref 11.5–15.5)
WBC: 9.3 K/uL (ref 4.0–10.5)
nRBC: 0 % (ref 0.0–0.2)

## 2024-09-22 NOTE — ED Notes (Signed)
 RN called lab about labs not in process. Per lab, lab will run them now.

## 2024-09-22 NOTE — ED Triage Notes (Signed)
 Pt comes via EMS from Memory care unit at [facility]. Pt had unwitnessed fall found on the floor by staff. Pt has knot on her head but no loc or thinners. Pt states little headache. Pt doesn't recall fall. Pt has dementia. Pt is spanish speaking.  VSS
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
## 2024-09-22 NOTE — ED Triage Notes (Signed)
 Pt in via [facility] from [facility]; patient w/ unwitnessed fall, patient is unable to recall the encounter.  Patient with laceration to posterior head, bleeding stopped at this time.    Patients daughter arrives during triage; states she is not on any blood thinners.  Patient HOH; mobile interpreter utilized.    Vitals WDL.
